# Patient Record
Sex: Female | Born: 1955 | Race: White | Hispanic: No | Marital: Married | Smoking: Former smoker
Health system: Southern US, Community
[De-identification: ages and names within clinical notes are randomized; demographics above are authoritative.]

## PROBLEM LIST (undated history)

## (undated) DIAGNOSIS — E0500 Thyrotoxicosis with diffuse goiter without thyrotoxic crisis or storm: Secondary | ICD-10-CM

## (undated) DIAGNOSIS — E039 Hypothyroidism, unspecified: Secondary | ICD-10-CM

## (undated) DIAGNOSIS — H269 Unspecified cataract: Secondary | ICD-10-CM

## (undated) DIAGNOSIS — I10 Essential (primary) hypertension: Secondary | ICD-10-CM

## (undated) DIAGNOSIS — E559 Vitamin D deficiency, unspecified: Secondary | ICD-10-CM

## (undated) DIAGNOSIS — G459 Transient cerebral ischemic attack, unspecified: Secondary | ICD-10-CM

## (undated) DIAGNOSIS — G43909 Migraine, unspecified, not intractable, without status migrainosus: Secondary | ICD-10-CM

## (undated) DIAGNOSIS — E785 Hyperlipidemia, unspecified: Secondary | ICD-10-CM

## (undated) DIAGNOSIS — E119 Type 2 diabetes mellitus without complications: Secondary | ICD-10-CM

## (undated) HISTORY — DX: Hypothyroidism, unspecified: E03.9

## (undated) HISTORY — DX: Vitamin D deficiency, unspecified: E55.9

## (undated) HISTORY — PX: ELBOW SURGERY: SHX618

## (undated) HISTORY — DX: Hyperlipidemia, unspecified: E78.5

## (undated) HISTORY — DX: Essential (primary) hypertension: I10

## (undated) HISTORY — DX: Transient cerebral ischemic attack, unspecified: G45.9

## (undated) HISTORY — DX: Thyrotoxicosis with diffuse goiter without thyrotoxic crisis or storm: E05.00

## (undated) HISTORY — DX: Type 2 diabetes mellitus without complications: E11.9

## (undated) HISTORY — DX: Migraine, unspecified, not intractable, without status migrainosus: G43.909

## (undated) HISTORY — DX: Unspecified cataract: H26.9

---

## 2009-10-01 ENCOUNTER — Emergency Department (HOSPITAL_BASED_OUTPATIENT_CLINIC_OR_DEPARTMENT_OTHER): Admission: EM | Admit: 2009-10-01 | Discharge: 2009-10-01 | Payer: Self-pay | Admitting: Emergency Medicine

## 2009-10-01 ENCOUNTER — Ambulatory Visit: Payer: Self-pay | Admitting: Diagnostic Radiology

## 2010-05-16 ENCOUNTER — Emergency Department (HOSPITAL_BASED_OUTPATIENT_CLINIC_OR_DEPARTMENT_OTHER)
Admission: EM | Admit: 2010-05-16 | Discharge: 2010-05-16 | Payer: Self-pay | Source: Home / Self Care | Admitting: Emergency Medicine

## 2010-05-16 ENCOUNTER — Emergency Department (HOSPITAL_COMMUNITY)
Admission: EM | Admit: 2010-05-16 | Discharge: 2010-05-17 | Payer: Self-pay | Source: Home / Self Care | Admitting: Emergency Medicine

## 2010-05-17 ENCOUNTER — Encounter (INDEPENDENT_AMBULATORY_CARE_PROVIDER_SITE_OTHER): Payer: Self-pay | Admitting: Emergency Medicine

## 2010-08-12 LAB — GLUCOSE, CAPILLARY
Glucose-Capillary: 110 mg/dL — ABNORMAL HIGH (ref 70–99)
Glucose-Capillary: 149 mg/dL — ABNORMAL HIGH (ref 70–99)

## 2010-08-12 LAB — POCT I-STAT, CHEM 8
BUN: 10 mg/dL (ref 6–23)
Calcium, Ion: 1.17 mmol/L (ref 1.12–1.32)
Chloride: 105 mEq/L (ref 96–112)
Potassium: 4.4 mEq/L (ref 3.5–5.1)

## 2010-08-12 LAB — DIFFERENTIAL
Basophils Absolute: 0 10*3/uL (ref 0.0–0.1)
Basophils Relative: 0 % (ref 0–1)
Eosinophils Absolute: 0.1 10*3/uL (ref 0.0–0.7)
Eosinophils Relative: 1 % (ref 0–5)
Lymphocytes Relative: 30 % (ref 12–46)
Lymphs Abs: 2.7 10*3/uL (ref 0.7–4.0)
Monocytes Absolute: 0.5 10*3/uL (ref 0.1–1.0)
Monocytes Relative: 6 % (ref 3–12)
Neutro Abs: 5.6 10*3/uL (ref 1.7–7.7)
Neutrophils Relative %: 63 % (ref 43–77)

## 2010-08-12 LAB — CBC
HCT: 39.4 % (ref 36.0–46.0)
Hemoglobin: 13.3 g/dL (ref 12.0–15.0)
MCH: 30.3 pg (ref 26.0–34.0)
MCHC: 33.8 g/dL (ref 30.0–36.0)
MCV: 89.7 fL (ref 78.0–100.0)
Platelets: 176 10*3/uL (ref 150–400)
RBC: 4.39 MIL/uL (ref 3.87–5.11)
RDW: 13.3 % (ref 11.5–15.5)
WBC: 8.9 10*3/uL (ref 4.0–10.5)

## 2010-08-12 LAB — URINALYSIS, ROUTINE W REFLEX MICROSCOPIC
Glucose, UA: NEGATIVE mg/dL
Ketones, ur: NEGATIVE mg/dL
Nitrite: NEGATIVE
Specific Gravity, Urine: 1.02 (ref 1.005–1.030)
pH: 5.5 (ref 5.0–8.0)

## 2010-08-12 LAB — LIPID PANEL
Cholesterol: 177 mg/dL (ref 0–200)
HDL: 35 mg/dL — ABNORMAL LOW (ref >39)
LDL Cholesterol: 119 mg/dL — ABNORMAL HIGH (ref 0–99)
Total CHOL/HDL Ratio: 5.1 RATIO
Triglycerides: 116 mg/dL (ref <150)
VLDL: 23 mg/dL (ref 0–40)

## 2010-08-12 LAB — APTT: aPTT: 26 seconds (ref 24–37)

## 2010-08-12 LAB — CK TOTAL AND CKMB (NOT AT ARMC)
CK, MB: 1 ng/mL (ref 0.3–4.0)
CK, MB: 1 ng/mL (ref 0.3–4.0)
Relative Index: INVALID (ref 0.0–2.5)
Relative Index: INVALID (ref 0.0–2.5)
Total CK: 47 U/L (ref 7–177)
Total CK: 48 U/L (ref 7–177)

## 2010-08-12 LAB — COMPREHENSIVE METABOLIC PANEL
ALT: 30 U/L (ref 0–35)
Albumin: 3.7 g/dL (ref 3.5–5.2)
Alkaline Phosphatase: 70 U/L (ref 39–117)
Calcium: 8.8 mg/dL (ref 8.4–10.5)
GFR calc Af Amer: >60 mL/min (ref >60)
Potassium: 4 mEq/L (ref 3.5–5.1)
Sodium: 140 mEq/L (ref 135–145)
Total Protein: 6.5 g/dL (ref 6.0–8.3)

## 2010-08-12 LAB — TROPONIN I
Troponin I: 0.01 ng/mL (ref 0.00–0.06)
Troponin I: 0.02 ng/mL (ref 0.00–0.06)

## 2010-08-12 LAB — HEMOGLOBIN A1C: Mean Plasma Glucose: 177 mg/dL — ABNORMAL HIGH (ref <117)

## 2010-08-12 LAB — PROTIME-INR: Prothrombin Time: 13.4 seconds (ref 11.6–15.2)

## 2011-06-05 IMAGING — CR DG KNEE COMPLETE 4+V*R*
4 series · 4 of 4 positions shown · non-contrast
Comparison: None.

CLINICAL DATA: Fall, right knee pain

RIGHT KNEE - COMPLETE 4+ VIEW

[t knee ap right]
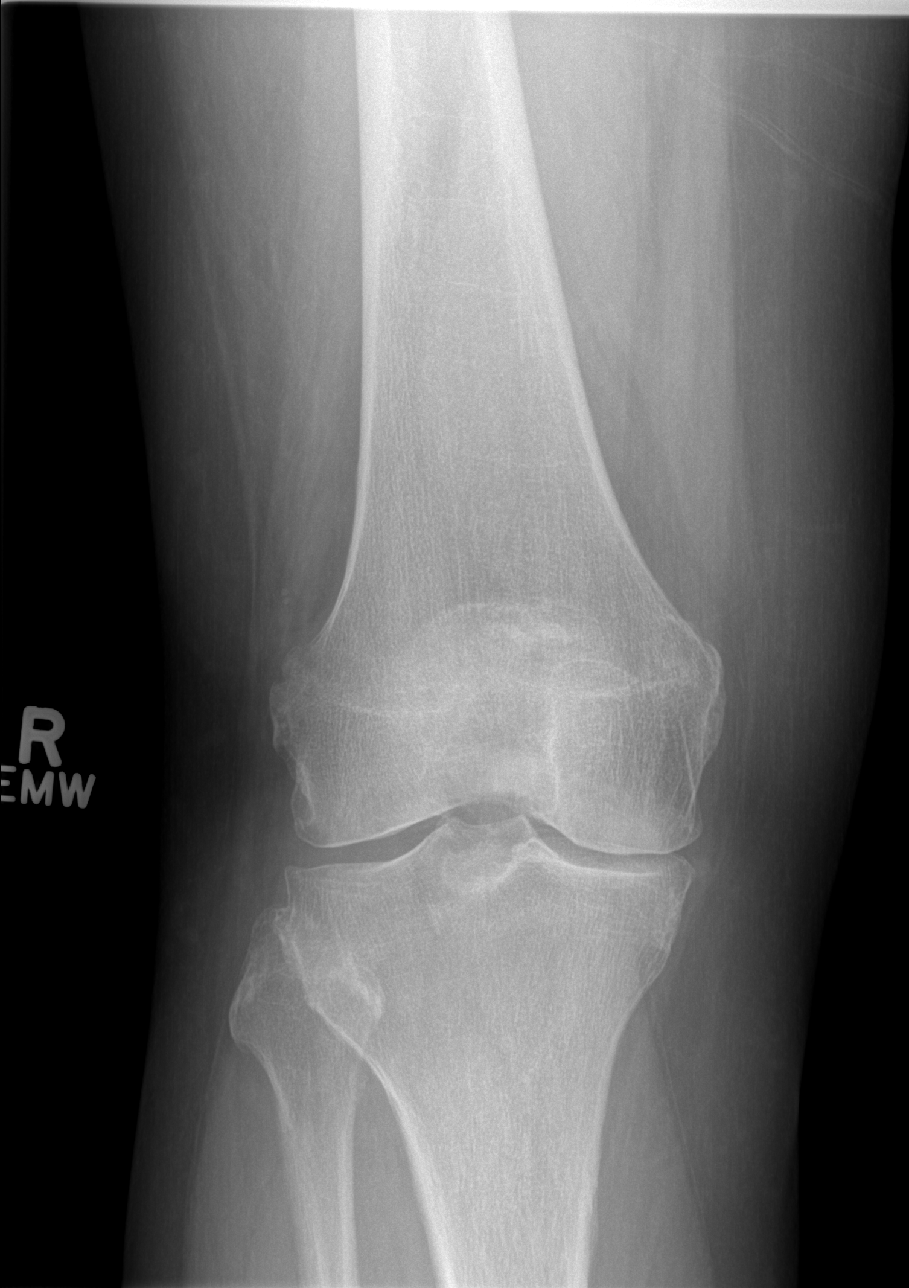

[t knee oblique right (1 of 2)]
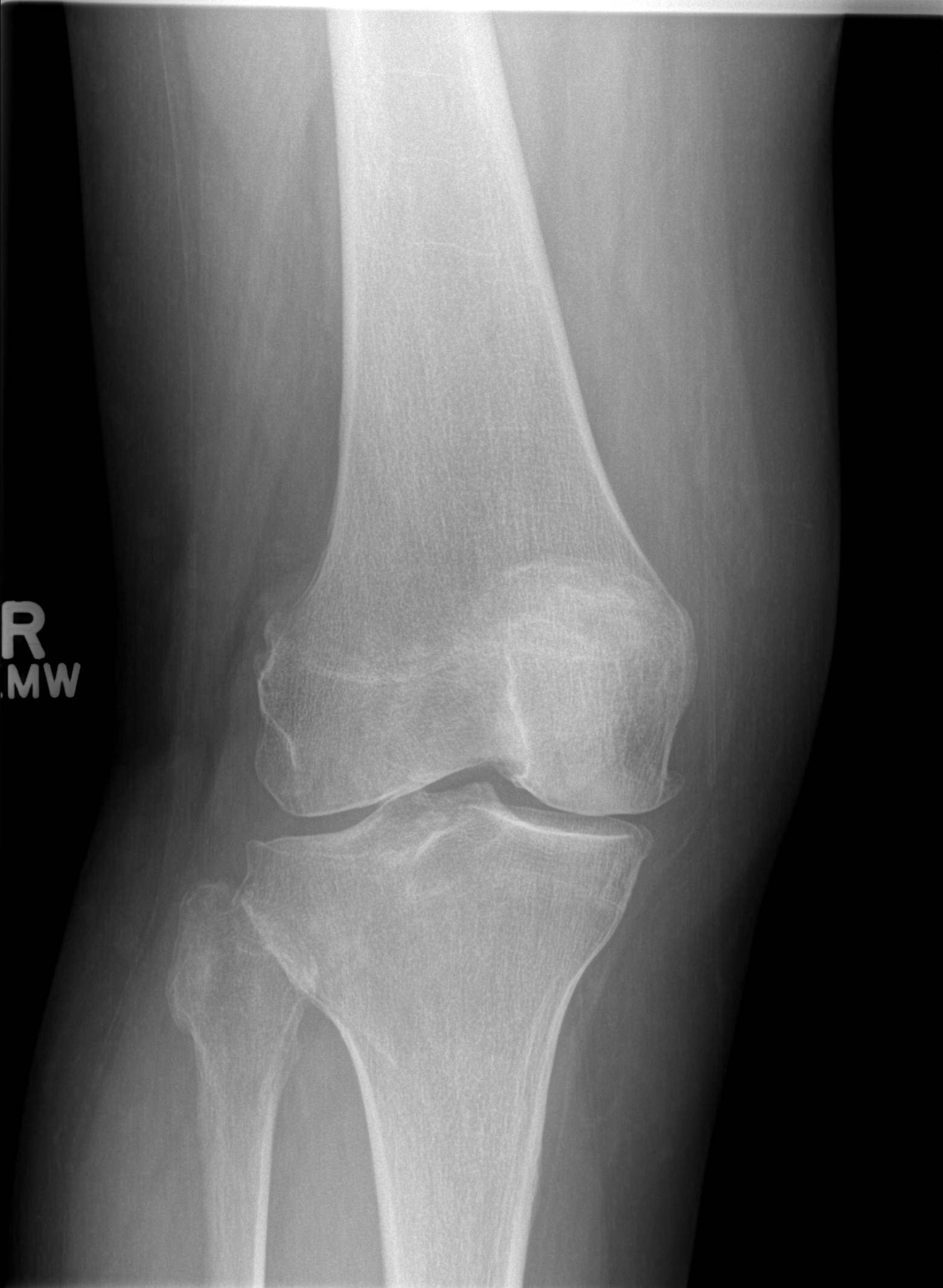

[t knee oblique right (2 of 2)]
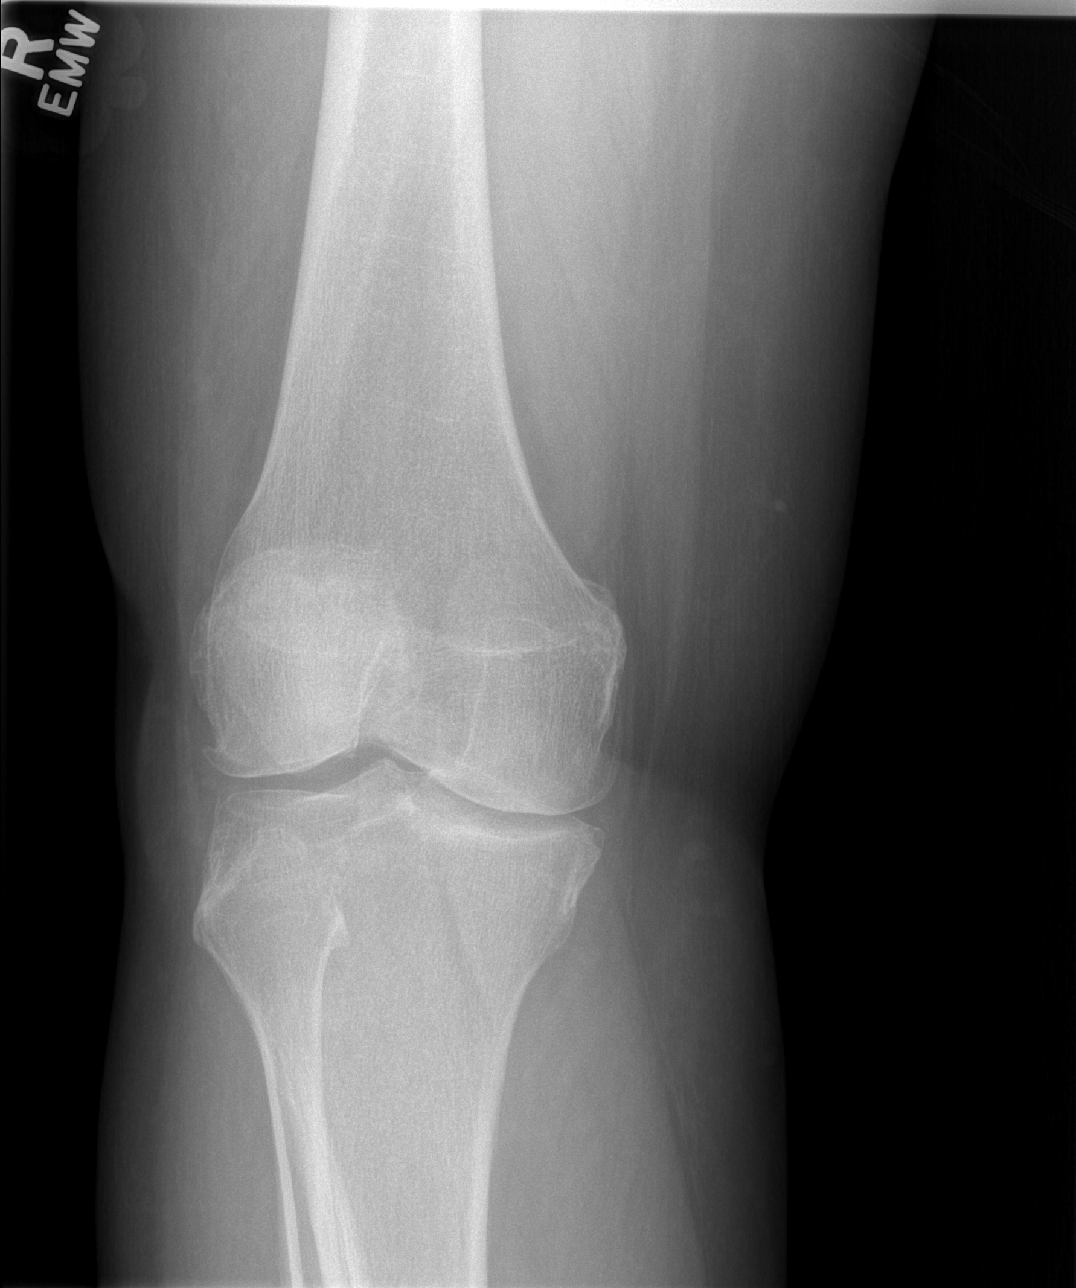

[t knee lat right]
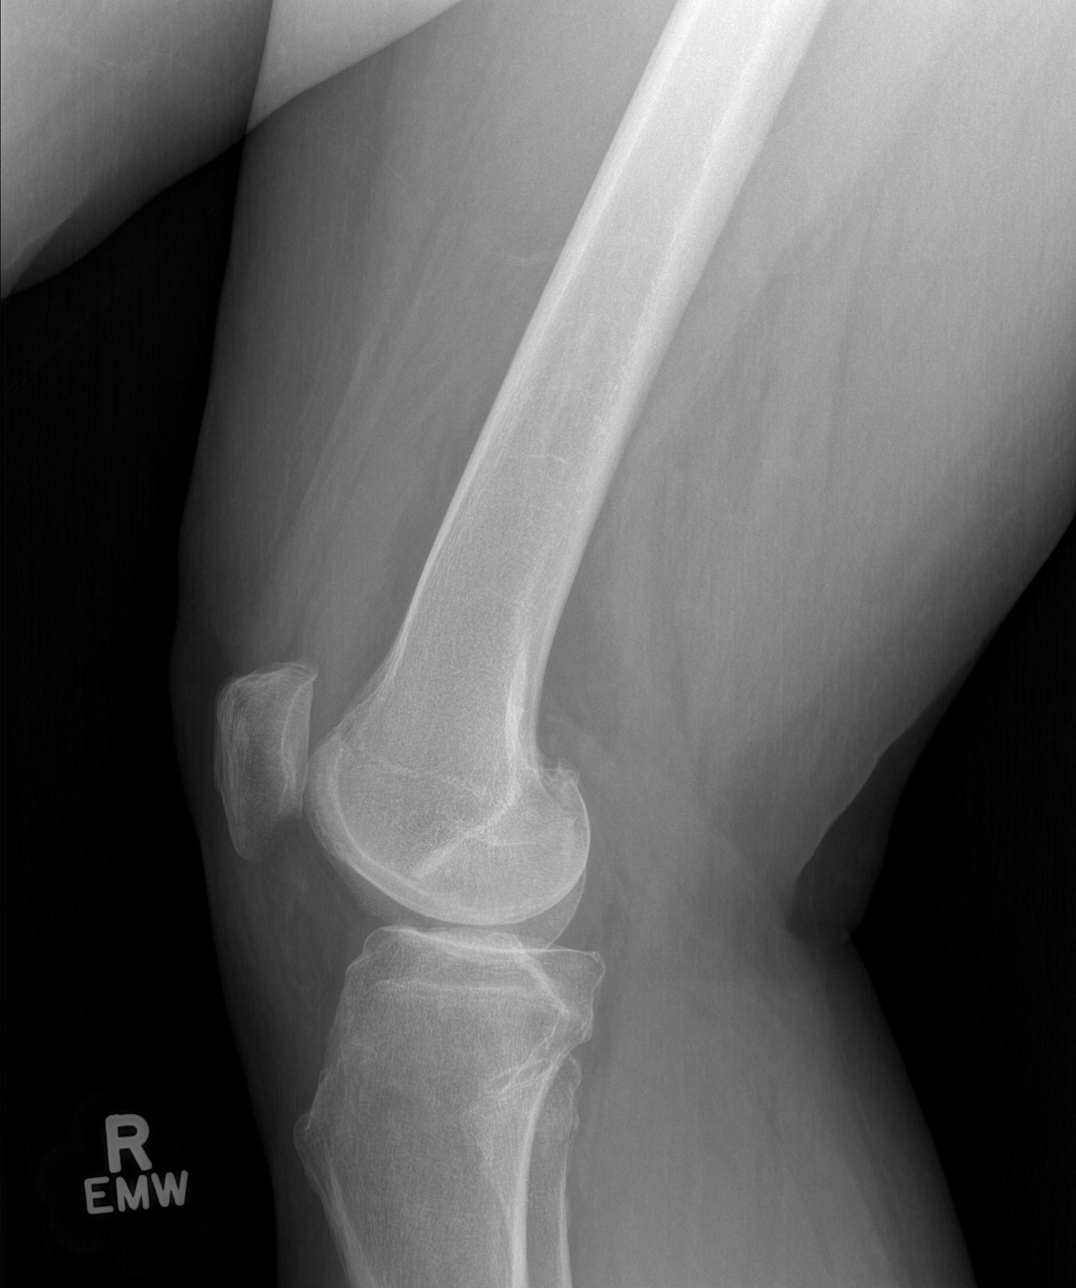

[4 of 4 positions shown; findings below may reference images not displayed]

FINDINGS: Four views of the right knee submitted.  No acute
fracture or subluxation.  Mild degenerative changes are noted with
narrowing of medial joint compartment and mild spurring of medial
femoral condyle and medial tibial plateau.  Probable small joint
effusion.
IMPRESSION: No acute fracture or subluxation.  Mild degenerative changes.

## 2012-09-11 ENCOUNTER — Other Ambulatory Visit: Payer: Self-pay | Admitting: Family Medicine

## 2012-09-13 NOTE — Telephone Encounter (Signed)
This patient has an appt in July.  She also needs to be scheduled for labs. Can you please add for labs a week before her appt. for her labs. Thanks. PG

## 2012-12-05 ENCOUNTER — Other Ambulatory Visit: Payer: Self-pay | Admitting: Family Medicine

## 2012-12-08 ENCOUNTER — Other Ambulatory Visit: Payer: Self-pay | Admitting: Family Medicine

## 2012-12-15 ENCOUNTER — Other Ambulatory Visit: Payer: Self-pay | Admitting: *Deleted

## 2012-12-15 DIAGNOSIS — E039 Hypothyroidism, unspecified: Secondary | ICD-10-CM

## 2012-12-15 DIAGNOSIS — E785 Hyperlipidemia, unspecified: Secondary | ICD-10-CM

## 2012-12-16 ENCOUNTER — Other Ambulatory Visit: Payer: Self-pay

## 2012-12-16 LAB — T3, FREE: T3, Free: 3.4 pg/mL (ref 2.3–4.2)

## 2012-12-16 LAB — COMPLETE METABOLIC PANEL WITH GFR
ALT: 39 U/L — ABNORMAL HIGH (ref 0–35)
AST: 36 U/L (ref 0–37)
Albumin: 4.4 g/dL (ref 3.5–5.2)
Alkaline Phosphatase: 75 U/L (ref 39–117)
BUN: 9 mg/dL (ref 6–23)
CO2: 25 mEq/L (ref 19–32)
Calcium: 9.4 mg/dL (ref 8.4–10.5)
Chloride: 98 mEq/L (ref 96–112)
Creat: 0.83 mg/dL (ref 0.50–1.10)
GFR, Est African American: >89 mL/min
GFR, Est Non African American: 78 mL/min
Glucose, Bld: 234 mg/dL — ABNORMAL HIGH (ref 70–99)
Potassium: 4.1 mEq/L (ref 3.5–5.3)
Sodium: 135 mEq/L (ref 135–145)
Total Bilirubin: 0.6 mg/dL (ref 0.3–1.2)
Total Protein: 6.9 g/dL (ref 6.0–8.3)

## 2012-12-16 LAB — LIPID PANEL
Cholesterol: 147 mg/dL (ref 0–200)
HDL: 38 mg/dL — ABNORMAL LOW (ref >39)
LDL Cholesterol: 76 mg/dL (ref 0–99)
Total CHOL/HDL Ratio: 3.9 Ratio
Triglycerides: 166 mg/dL — ABNORMAL HIGH (ref <150)
VLDL: 33 mg/dL (ref 0–40)

## 2012-12-16 LAB — TSH: TSH: 3.695 u[IU]/mL (ref 0.350–4.500)

## 2012-12-16 LAB — T4, FREE: Free T4: 1.55 ng/dL (ref 0.80–1.80)

## 2012-12-20 ENCOUNTER — Encounter: Payer: Self-pay | Admitting: Family Medicine

## 2012-12-20 ENCOUNTER — Ambulatory Visit (INDEPENDENT_AMBULATORY_CARE_PROVIDER_SITE_OTHER): Payer: Commercial Indemnity | Admitting: Family Medicine

## 2012-12-20 VITALS — BP 115/80 | HR 71 | Wt 213.0 lb

## 2012-12-20 DIAGNOSIS — E039 Hypothyroidism, unspecified: Secondary | ICD-10-CM

## 2012-12-20 DIAGNOSIS — E119 Type 2 diabetes mellitus without complications: Secondary | ICD-10-CM

## 2012-12-20 DIAGNOSIS — M25569 Pain in unspecified knee: Secondary | ICD-10-CM

## 2012-12-20 DIAGNOSIS — E785 Hyperlipidemia, unspecified: Secondary | ICD-10-CM

## 2012-12-20 DIAGNOSIS — M25562 Pain in left knee: Secondary | ICD-10-CM

## 2012-12-20 DIAGNOSIS — N760 Acute vaginitis: Secondary | ICD-10-CM

## 2012-12-20 DIAGNOSIS — I10 Essential (primary) hypertension: Secondary | ICD-10-CM

## 2012-12-20 LAB — T3, REVERSE: T3, Reverse: 22 ng/dL (ref 8–25)

## 2012-12-20 LAB — POCT GLYCOSYLATED HEMOGLOBIN (HGB A1C): Hemoglobin A1C: 9.2

## 2012-12-20 MED ORDER — DAPAGLIFLOZIN PROPANEDIOL 10 MG PO TABS: 10.0000 mg | ORAL_TABLET | Freq: Every day | ORAL | Status: DC

## 2012-12-20 MED ORDER — METRONIDAZOLE 0.75 % VA GEL: 1.0000 | Freq: Two times a day (BID) | VAGINAL | Status: DC

## 2012-12-20 MED ORDER — LIOTHYRONINE SODIUM 5 MCG PO TABS: 5.0000 ug | ORAL_TABLET | Freq: Every day | ORAL | Status: AC

## 2012-12-20 MED ORDER — SITAGLIP PHOS-METFORMIN HCL ER 50-1000 MG PO TB24: 1.0000 | ORAL_TABLET | Freq: Two times a day (BID) | ORAL | Status: DC

## 2012-12-20 MED ORDER — PRAVASTATIN SODIUM 40 MG PO TABS: 40.0000 mg | ORAL_TABLET | Freq: Every day | ORAL | Status: DC

## 2012-12-20 MED ORDER — INSULIN DETEMIR 100 UNIT/ML FLEXPEN: PEN_INJECTOR | SUBCUTANEOUS | Status: DC

## 2012-12-20 MED ORDER — DICLOFENAC SODIUM 1 % TD GEL: TRANSDERMAL | Status: AC

## 2012-12-20 MED ORDER — LEVOTHYROXINE SODIUM 150 MCG PO TABS: 150.0000 ug | ORAL_TABLET | Freq: Every day | ORAL | Status: DC

## 2012-12-20 MED ORDER — ATENOLOL 50 MG PO TABS: 50.0000 mg | ORAL_TABLET | Freq: Every day | ORAL | Status: DC

## 2012-12-20 NOTE — Progress Notes (Signed)
  Subjective:    Patient ID: Jocelyn Todd, female    DOB: [birth year], 57 y.o.   MRN: [id number]  HPI  [name] is here today with her husband to go over her most recent lab results, discuss the conditions listed below and get several of her medications refilled.    1)  Type II DM:  Her sugars are not looking as good as they were.  She stopped taking Invokana because she felt that it was giving her a vaginal/bladder infection.  She is still taking Janumet XR and Levemir.    2)  Hypothyroidism:  She feels her current dosage of Cytomel and Synthroid are not helping with her feeling fatigued.    3)  Left Knee Pain:  She is having trouble with left knee pain.  She fell about two months ago and injured her left hip and knee.  She did not have a medical evaluation after her fall and she continues to struggle with  pain.  She added Glucosamine which has helped her some.     Review of Systems  Constitutional: Positive for fatigue.  Musculoskeletal: Positive for arthralgias.       Left hip and left knee  Psychiatric/Behavioral:       Irritability     Past Medical History  Diagnosis Date  . Hypertension   . Diabetes mellitus without complication     Type II  . Hyperlipidemia   . Migraine headache   . Hypothyroidism   . Graves disease   . TIA (transient ischemic attack)   . Vitamin D deficiency     Family History  Problem Relation Age of Onset  . Diabetes Mother   . Hypertension Mother   . Heart disease Father   . Diabetes Father   . Hypertension Father     History   Social History Narrative   Marital Status: Married Engineer, technical sales)   Children:  Step-Daughter Carlyon Shadow) Son Earna Coder)    Pets:  Cat   Living Situation: Lives with spouse and son    Occupation: Armed forces logistics/support/administrative officer for [employer]   Education: Engineer, agricultural   Tobacco Use/Exposure:  Quit in 2000 after 15 years of smoking   Alcohol Use:  Occasional   Drug Use:  None   Diet:  Weight Watchers   Exercise:   Moderate ([place])    Hobbies: Cross-Stitch, Cooking               Objective:   Physical Exam  Constitutional: She appears well-nourished. No distress.  HENT:  Head: Normocephalic.  Eyes: No scleral icterus.  Neck: No thyromegaly present.  Cardiovascular: Normal rate, regular rhythm and normal heart sounds.   Pulmonary/Chest: Effort normal and breath sounds normal.  Abdominal: There is no tenderness.  Musculoskeletal: She exhibits no edema and no tenderness.  Neurological: She is alert.  Skin: Skin is warm and dry.  Psychiatric: She has a normal mood and affect. Her behavior is normal. Judgment and thought content normal.          Assessment & Plan:

## 2012-12-20 NOTE — Patient Instructions (Addendum)
1)  Blood Sugar - A1c is too high so...you need to either increase the Levemir and/or add some [place].  Continue to try to work hard on diet and exercise.        Diabetes and Exercise Regular exercise is important and can help:   Control blood glucose (sugar).  Decrease blood pressure.    Control blood lipids (cholesterol, triglycerides).  Improve overall health. BENEFITS FROM EXERCISE  Improved fitness.  Improved flexibility.  Improved endurance.  Increased bone density.  Weight control.  Increased muscle strength.  Decreased body fat.  Improvement of the body's use of insulin, a hormone.  Increased insulin sensitivity.  Reduction of insulin needs.  Reduced stress and tension.  Helps you feel better. People with diabetes who add exercise to their lifestyle gain additional benefits, including:  Weight loss.  Reduced appetite.  Improvement of the body's use of blood glucose.  Decreased risk factors for heart disease:  Lowering of cholesterol and triglycerides.  Raising the level of good cholesterol (high-density lipoproteins, HDL).  Lowering blood sugar.  Decreased blood pressure. TYPE 1 DIABETES AND EXERCISE  Exercise will usually lower your blood glucose.  If blood glucose is greater than 240 mg/dl, check urine ketones. If ketones are present, do not exercise.  Location of the insulin injection sites may need to be adjusted with exercise. Avoid injecting insulin into areas of the body that will be exercised. For example, avoid injecting insulin into:  The arms when playing tennis.  The legs when jogging. For more information, discuss this with your caregiver.  Keep a record of:  Food intake.  Type and amount of exercise.  Expected peak times of insulin action.  Blood glucose levels. Do this before, during, and after exercise. Review your records with your caregiver. This will help you to develop guidelines for adjusting food intake  and insulin amounts.  TYPE 2 DIABETES AND EXERCISE  Regular physical activity can help control blood glucose.  Exercise is important because it may:  Increase the body's sensitivity to insulin.  Improve blood glucose control.  Exercise reduces the risk of heart disease. It decreases serum cholesterol and triglycerides. It also lowers blood pressure.  Those who take insulin or oral hypoglycemic agents should watch for signs of hypoglycemia. These signs include dizziness, shaking, sweating, chills, and confusion.  Body water is lost during exercise. It must be replaced. This will help to avoid loss of body fluids (dehydration) or heat stroke. Be sure to talk to your caregiver before starting an exercise program to make sure it is safe for you. Remember, any activity is better than none.  Document Released: 08/09/2003 Document Revised: 08/11/2011 Document Reviewed: 11/23/2008 [facility] Patient Information 2014 [place], [place].

## 2013-01-29 ENCOUNTER — Encounter: Payer: Self-pay | Admitting: Family Medicine

## 2013-01-29 DIAGNOSIS — N760 Acute vaginitis: Secondary | ICD-10-CM | POA: Insufficient documentation

## 2013-01-29 DIAGNOSIS — E119 Type 2 diabetes mellitus without complications: Secondary | ICD-10-CM | POA: Insufficient documentation

## 2013-01-29 DIAGNOSIS — E039 Hypothyroidism, unspecified: Secondary | ICD-10-CM | POA: Insufficient documentation

## 2013-01-29 DIAGNOSIS — I10 Essential (primary) hypertension: Secondary | ICD-10-CM | POA: Insufficient documentation

## 2013-01-29 DIAGNOSIS — E785 Hyperlipidemia, unspecified: Secondary | ICD-10-CM | POA: Insufficient documentation

## 2013-03-21 ENCOUNTER — Ambulatory Visit (INDEPENDENT_AMBULATORY_CARE_PROVIDER_SITE_OTHER): Payer: Commercial Indemnity | Admitting: *Deleted

## 2013-03-21 DIAGNOSIS — Z23 Encounter for immunization: Secondary | ICD-10-CM

## 2013-04-12 ENCOUNTER — Other Ambulatory Visit: Payer: Self-pay | Admitting: Family Medicine

## 2013-05-28 ENCOUNTER — Other Ambulatory Visit: Payer: Self-pay | Admitting: Family Medicine

## 2013-06-24 ENCOUNTER — Other Ambulatory Visit: Payer: Self-pay | Admitting: Family Medicine

## 2013-07-18 ENCOUNTER — Other Ambulatory Visit: Payer: Self-pay | Admitting: *Deleted

## 2013-07-18 DIAGNOSIS — R5381 Other malaise: Secondary | ICD-10-CM

## 2013-07-18 DIAGNOSIS — E785 Hyperlipidemia, unspecified: Secondary | ICD-10-CM

## 2013-07-18 DIAGNOSIS — E1165 Type 2 diabetes mellitus with hyperglycemia: Secondary | ICD-10-CM

## 2013-07-18 DIAGNOSIS — R5383 Other fatigue: Secondary | ICD-10-CM

## 2013-07-18 DIAGNOSIS — IMO0001 Reserved for inherently not codable concepts without codable children: Secondary | ICD-10-CM

## 2013-07-18 DIAGNOSIS — E039 Hypothyroidism, unspecified: Secondary | ICD-10-CM

## 2013-07-19 ENCOUNTER — Other Ambulatory Visit: Payer: Commercial Indemnity

## 2013-07-20 ENCOUNTER — Other Ambulatory Visit: Payer: Commercial Indemnity

## 2013-07-21 ENCOUNTER — Other Ambulatory Visit (INDEPENDENT_AMBULATORY_CARE_PROVIDER_SITE_OTHER): Payer: Commercial Indemnity

## 2013-07-21 DIAGNOSIS — I10 Essential (primary) hypertension: Secondary | ICD-10-CM

## 2013-07-21 DIAGNOSIS — E119 Type 2 diabetes mellitus without complications: Secondary | ICD-10-CM

## 2013-07-21 DIAGNOSIS — E039 Hypothyroidism, unspecified: Secondary | ICD-10-CM

## 2013-07-21 DIAGNOSIS — E785 Hyperlipidemia, unspecified: Secondary | ICD-10-CM

## 2013-07-21 LAB — CBC WITH DIFFERENTIAL/PLATELET
Basophils Absolute: 0.1 10*3/uL (ref 0.0–0.1)
Basophils Relative: 1 % (ref 0–1)
Eosinophils Absolute: 0.4 10*3/uL (ref 0.0–0.7)
Eosinophils Relative: 4 % (ref 0–5)
HCT: 45 % (ref 36.0–46.0)
Hemoglobin: 15.4 g/dL — ABNORMAL HIGH (ref 12.0–15.0)
Lymphocytes Relative: 28 % (ref 12–46)
Lymphs Abs: 2.6 10*3/uL (ref 0.7–4.0)
MCH: 30.4 pg (ref 26.0–34.0)
MCHC: 34.2 g/dL (ref 30.0–36.0)
MCV: 88.9 fL (ref 78.0–100.0)
Monocytes Absolute: 0.5 10*3/uL (ref 0.1–1.0)
Monocytes Relative: 5 % (ref 3–12)
Neutro Abs: 5.7 10*3/uL (ref 1.7–7.7)
Neutrophils Relative %: 62 % (ref 43–77)
Platelets: 200 10*3/uL (ref 150–400)
RBC: 5.06 MIL/uL (ref 3.87–5.11)
RDW: 15.2 % (ref 11.5–15.5)
WBC: 9.2 10*3/uL (ref 4.0–10.5)

## 2013-07-21 LAB — LIPID PANEL
Cholesterol: 157 mg/dL (ref 0–200)
HDL: 45 mg/dL (ref >39)
LDL Cholesterol: 73 mg/dL (ref 0–99)
Total CHOL/HDL Ratio: 3.5 Ratio
Triglycerides: 197 mg/dL — ABNORMAL HIGH (ref <150)
VLDL: 39 mg/dL (ref 0–40)

## 2013-07-21 LAB — HEMOGLOBIN A1C
Hgb A1c MFr Bld: 9.2 % — ABNORMAL HIGH (ref <5.7)
Mean Plasma Glucose: 217 mg/dL — ABNORMAL HIGH (ref <117)

## 2013-07-21 LAB — COMPLETE METABOLIC PANEL WITH GFR
ALT: 51 U/L — ABNORMAL HIGH (ref 0–35)
AST: 41 U/L — ABNORMAL HIGH (ref 0–37)
Albumin: 4.8 g/dL (ref 3.5–5.2)
Alkaline Phosphatase: 77 U/L (ref 39–117)
BUN: 13 mg/dL (ref 6–23)
CO2: 28 mEq/L (ref 19–32)
Calcium: 10 mg/dL (ref 8.4–10.5)
Chloride: 99 mEq/L (ref 96–112)
Creat: 0.7 mg/dL (ref 0.50–1.10)
GFR, Est African American: >89 mL/min
GFR, Est Non African American: >89 mL/min
Glucose, Bld: 242 mg/dL — ABNORMAL HIGH (ref 70–99)
Potassium: 4.4 mEq/L (ref 3.5–5.3)
Sodium: 137 mEq/L (ref 135–145)
Total Bilirubin: 0.6 mg/dL (ref 0.2–1.2)
Total Protein: 7.5 g/dL (ref 6.0–8.3)

## 2013-07-21 LAB — T3, FREE: T3, Free: 3.4 pg/mL (ref 2.3–4.2)

## 2013-07-21 LAB — T4, FREE: Free T4: 1.48 ng/dL (ref 0.80–1.80)

## 2013-07-21 LAB — TSH: TSH: 2.222 u[IU]/mL (ref 0.350–4.500)

## 2013-08-05 ENCOUNTER — Encounter: Payer: Self-pay | Admitting: Family Medicine

## 2013-08-05 ENCOUNTER — Encounter (INDEPENDENT_AMBULATORY_CARE_PROVIDER_SITE_OTHER): Payer: Self-pay

## 2013-08-05 ENCOUNTER — Ambulatory Visit (INDEPENDENT_AMBULATORY_CARE_PROVIDER_SITE_OTHER): Payer: Commercial Indemnity | Admitting: Family Medicine

## 2013-08-05 VITALS — BP 133/82 | HR 64 | Resp 16 | Ht 64.0 in | Wt 207.0 lb

## 2013-08-05 DIAGNOSIS — R110 Nausea: Secondary | ICD-10-CM

## 2013-08-05 DIAGNOSIS — IMO0001 Reserved for inherently not codable concepts without codable children: Secondary | ICD-10-CM

## 2013-08-05 DIAGNOSIS — E1165 Type 2 diabetes mellitus with hyperglycemia: Secondary | ICD-10-CM

## 2013-08-05 DIAGNOSIS — Z789 Other specified health status: Secondary | ICD-10-CM

## 2013-08-05 DIAGNOSIS — E785 Hyperlipidemia, unspecified: Secondary | ICD-10-CM

## 2013-08-05 DIAGNOSIS — E039 Hypothyroidism, unspecified: Secondary | ICD-10-CM

## 2013-08-05 MED ORDER — INSULIN PEN NEEDLE 32G X 6 MM MISC: 1.0000 "pen " | Freq: Every day | Status: DC

## 2013-08-05 MED ORDER — DAPAGLIFLOZIN PRO-METFORMIN ER 10-1000 MG PO TB24: 1.0000 | ORAL_TABLET | Freq: Every day | ORAL | Status: DC

## 2013-08-05 MED ORDER — LEVOTHYROXINE SODIUM 150 MCG PO TABS: 150.0000 ug | ORAL_TABLET | Freq: Every day | ORAL | Status: AC

## 2013-08-05 MED ORDER — PROMETHAZINE HCL 12.5 MG PO TABS: ORAL_TABLET | ORAL | Status: DC

## 2013-08-05 MED ORDER — LIRAGLUTIDE 18 MG/3ML ~~LOC~~ SOPN: 1.8000 mg | PEN_INJECTOR | Freq: Every day | SUBCUTANEOUS | Status: DC

## 2013-08-05 NOTE — Progress Notes (Signed)
Subjective:    Patient ID: Jocelyn LowensteinCynthia Todd, female    DOB: 1956/03/11, 58 y.o.   MRN: [id number]  HPI  Jocelyn Todd is here today to go over her most recent lab results, get medication refills and to go over the conditions listed below:    1)  Left Hip Pain - She has been struggling with hip pain which she describes as "severe" at times.  She takes Glucosamine and tries to walk and that relieves some of her pain.  She was told at [facility] that she has arthritis.  She would like to have a disability form completed.    2) Type II DM - She has not been watching her sugars very carefully.  She has been taking a combination of Janumet and [place]xiga.    3)  Hypothyroidism - She feels fine on levothyroxine 137 mcg.    4)  Hyperlipidemia - She is doing well on pravastatin.     Review of Systems  Constitutional: Negative for activity change, appetite change, fatigue and unexpected weight change.  HENT: Negative.   Eyes: Negative.   Respiratory: Negative for chest tightness and shortness of breath.   Cardiovascular: Negative for chest pain, palpitations and leg swelling.  Gastrointestinal: Negative for diarrhea and constipation.  Endocrine: Negative.  Negative for polydipsia, polyphagia and polyuria.  Genitourinary: Negative for urgency, frequency and difficulty urinating.  Musculoskeletal: Negative.        Left Hip Pain  Skin: Negative.   Neurological: Negative.  Negative for light-headedness and numbness.  Hematological: Negative for adenopathy. Does not bruise/bleed easily.  Psychiatric/Behavioral: Negative for sleep disturbance and dysphoric mood. The patient is not nervous/anxious.     Past Medical History  Diagnosis Date  . Hypertension   . Diabetes mellitus without complication     Type II  . Hyperlipidemia   . Migraine headache   . Hypothyroidism   . Graves disease   . TIA (transient ischemic attack)   . Vitamin D deficiency   . Cataract      Past Surgical  History  Procedure Laterality Date  . 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 Diabetes Mother   . Hypertension Mother   . Heart disease Father   . Diabetes Father   . Hypertension Father      Current Outpatient Prescriptions on File Prior to Visit  Medication Sig Dispense Refill  . aspirin 325 MG tablet Take 325 mg by mouth daily.      Marland Kitchen. atenolol (TENORMIN) 50 MG tablet Take 1 tablet (50 mg total) by mouth daily.  30 tablet  11  . Cholecalciferol (HM VITAMIN D3) 2000 UNITS CAPS Take 1 capsule by mouth daily.      . Dapagliflozin Propanediol (FARXIGA) 10 MG TABS Take 10 mg by mouth daily.  30 tablet  5  . diclofenac sodium (VOLTAREN) 1 % GEL Apply up to 4 grams to 2 painful areas up to QID  10 Tube  5  . glucose blood test strip 1 each by Other route as needed for other. Use as instructed      . JANUMET XR 50-1000 MG TB24 TAKE 1 TWICE DAILY  60 tablet  0  . Lancets MISC by Does not apply route 3 (three) times daily.      Marland Kitchen liothyronine (CYTOMEL) 5 MCG tablet Take 1 tablet (5 mcg total) by mouth daily.  30 tablet  11  . pravastatin (PRAVACHOL) 40 MG tablet Take 1 tablet (40 mg total) by mouth at bedtime.  30 tablet  11  . vitamin B-12 (CYANOCOBALAMIN) 1000 MCG tablet Take 1,000 mcg by mouth daily.       No current facility-administered medications on file prior to visit.     Allergies  Allergen Reactions  . Benadryl [Diphenhydramine]     Headache   . Codeine       Severe mood change     Immunization History  Administered Date(s) Administered  . Influenza,inj,Quad PF,36+ Mos 03/21/2013  . Pneumococcal Conjugate-13 03/22/2009  . Tdap 02/04/2007        Objective:   Physical Exam  Vitals reviewed. Constitutional: She is oriented to person, place, and time.  Eyes: Conjunctivae are normal. No scleral icterus.  Neck: Neck supple. No thyromegaly present.  Cardiovascular: Normal rate, regular rhythm and normal heart sounds.   Pulmonary/Chest: Effort normal and breath sounds normal.  Musculoskeletal: She exhibits no edema and no tenderness.  Lymphadenopathy:    She has no cervical adenopathy.  Neurological: She is alert and oriented to person, place, and time.  Skin: Skin is warm and dry.  Psychiatric: She has a normal mood and affect. Her behavior is normal. Judgment and thought content normal.      Assessment & Plan:    Jocelyn Todd was seen today for medication management.  Diagnoses and associated orders for this visit:  Other and unspecified hyperlipidemia Comments: Refilled her pravastatin.   Unspecified hypothyroidism Comments: We'll increase her Synthroid to 150 mcg to try to get her TSH closer to 1.   - levothyroxine (SYNTHROID, LEVOTHROID) 150 MCG tablet; Take 1 tablet (150 mcg total) by mouth daily before breakfast. - TSH; Future - T4, free; Future - T3, free; Future  Type II or unspecified type diabetes mellitus without mention of complication, uncontrolled Comments: We are losing control of her sugar.  She is going to try the combination of Xigduo XR and Victoza.  She was also encouraged to get Dr. Miguel Todd book  " The End of Diabetes".  - Dapagliflozin-Metformin HCl ER (XIGDUO XR) 03-999 MG TB24; Take 1 tablet by mouth daily. - Liraglutide 18 MG/3ML SOPN; Inject 1.8 mg into the skin at bedtime. - Insulin Pen Needle (NOVOFINE) 32G X 6 MM MISC; Inject 1 pen into the skin daily. - COMPLETE METABOLIC PANEL WITH GFR;  Future - Hemoglobin A1c; Future  Unknown varicella vaccination status - Varicella zoster antibody, IgG; Future  Nausea alone - promethazine (PHENERGAN) 12.5 MG tablet; Take 1/2 - 1 tab every 4-6 hours for nausea   TIME SPENT "FACE TO FACE" WITH PATIENT -  40 MINS

## 2013-08-05 NOTE — Patient Instructions (Addendum)
1)  Type II DM - We are changing your meds to the following:  Farxiga to Xigduo XR and add Victoza .6 mg then 1.2 then 1.8 - take some Phenergan if needed for nausea.  Look into getting the book by Dr. Monico HoarJoel Fuhrman - The End of Diabetes. Increase your exercise.  Get eye exam.  We will recheck your labs in 3 months.    2)  Hypothyroid - We are increasing your Synthroid to 150 mcg.  Take one extra per week of your 137 till gone.      Diabetes and Exercise Exercising regularly is important. It is not just about losing weight. It has many health benefits, such as:  Improving your overall fitness, flexibility, and endurance.  Increasing your bone density.  Helping with weight control.  Decreasing your body fat.  Increasing your muscle strength.  Reducing stress and tension.  Improving your overall health. People with diabetes who exercise gain additional benefits because exercise:  Reduces appetite.  Improves the body's use of blood sugar (glucose).  Helps lower or control blood glucose.  Decreases blood pressure.  Helps control blood lipids (such as cholesterol and triglycerides).  Improves the body's use of the hormone insulin by:  Increasing the body's insulin sensitivity.  Reducing the body's insulin needs.  Decreases the risk for heart disease because exercising:  Lowers cholesterol and triglycerides levels.  Increases the levels of good cholesterol (such as high-density lipoproteins [HDL]) in the body.  Lowers blood glucose levels. YOUR ACTIVITY PLAN  Choose an activity that you enjoy and set realistic goals. Your health care provider or diabetes educator can help you make an activity plan that works for you. You can break activities into 2 or 3 sessions throughout the day. Doing so is as good as one long session. Exercise ideas include:  Taking the dog for a walk.  Taking the stairs instead of the elevator.  Dancing to your favorite song.  Doing your favorite  exercise with a friend. RECOMMENDATIONS FOR EXERCISING WITH TYPE 1 OR TYPE 2 DIABETES   Check your blood glucose before exercising. If blood glucose levels are greater than 240 mg/dL, check for urine ketones. Do not exercise if ketones are present.  Avoid injecting insulin into areas of the body that are going to be exercised. For example, avoid injecting insulin into:  The arms when playing tennis.  The legs when jogging.  Keep a record of:  Food intake before and after you exercise.  Expected peak times of insulin action.  Blood glucose levels before and after you exercise.  The type and amount of exercise you have done.  Review your records with your health care provider. Your health care provider will help you to develop guidelines for adjusting food intake and insulin amounts before and after exercising.  If you take insulin or oral hypoglycemic agents, watch for signs and symptoms of hypoglycemia. They include:  Dizziness.  Shaking.  Sweating.  Chills.  Confusion.  Drink plenty of water while you exercise to prevent dehydration or heat stroke. Body water is lost during exercise and must be replaced.  Talk to your health care provider before starting an exercise program to make sure it is safe for you. Remember, almost any type of activity is better than none. Document Released: 08/09/2003 Document Revised: 01/19/2013 Document Reviewed: 10/26/2012 [facility] Patient Information 2014 [place], [place].

## 2013-08-06 ENCOUNTER — Other Ambulatory Visit: Payer: Self-pay | Admitting: Family Medicine

## 2013-08-20 ENCOUNTER — Other Ambulatory Visit: Payer: Self-pay | Admitting: Family Medicine

## 2013-09-29 LAB — HM DIABETES EYE EXAM

## 2013-10-28 ENCOUNTER — Encounter: Payer: Self-pay | Admitting: *Deleted

## 2013-11-04 ENCOUNTER — Other Ambulatory Visit: Payer: Self-pay | Admitting: *Deleted

## 2013-11-04 DIAGNOSIS — IMO0001 Reserved for inherently not codable concepts without codable children: Secondary | ICD-10-CM

## 2013-11-04 DIAGNOSIS — Z789 Other specified health status: Secondary | ICD-10-CM

## 2013-11-04 DIAGNOSIS — E039 Hypothyroidism, unspecified: Secondary | ICD-10-CM

## 2013-11-04 DIAGNOSIS — E1165 Type 2 diabetes mellitus with hyperglycemia: Secondary | ICD-10-CM

## 2013-11-07 ENCOUNTER — Other Ambulatory Visit: Payer: Commercial Indemnity

## 2013-11-07 LAB — COMPLETE METABOLIC PANEL WITH GFR
ALT: 39 U/L — ABNORMAL HIGH (ref 0–35)
AST: 33 U/L (ref 0–37)
Albumin: 4.4 g/dL (ref 3.5–5.2)
Alkaline Phosphatase: 69 U/L (ref 39–117)
BUN: 11 mg/dL (ref 6–23)
CO2: 25 mEq/L (ref 19–32)
Calcium: 9.3 mg/dL (ref 8.4–10.5)
Chloride: 99 mEq/L (ref 96–112)
Creat: 0.63 mg/dL (ref 0.50–1.10)
GFR, Est African American: >89 mL/min
GFR, Est Non African American: >89 mL/min
Glucose, Bld: 148 mg/dL — ABNORMAL HIGH (ref 70–99)
Potassium: 4.3 mEq/L (ref 3.5–5.3)
Sodium: 137 mEq/L (ref 135–145)
Total Bilirubin: 0.7 mg/dL (ref 0.2–1.2)
Total Protein: 6.9 g/dL (ref 6.0–8.3)

## 2013-11-07 LAB — HEMOGLOBIN A1C
Hgb A1c MFr Bld: 8.1 % — ABNORMAL HIGH (ref <5.7)
Mean Plasma Glucose: 186 mg/dL — ABNORMAL HIGH (ref <117)

## 2013-11-08 LAB — VARICELLA ZOSTER ANTIBODY, IGG: Varicella IgG: 527.7 Index — ABNORMAL HIGH (ref <135.00)

## 2013-11-08 LAB — T3, FREE: T3, Free: 4.1 pg/mL (ref 2.3–4.2)

## 2013-11-08 LAB — T4, FREE: Free T4: 1.8 ng/dL (ref 0.80–1.80)

## 2013-11-08 LAB — TSH: TSH: 0.057 u[IU]/mL — ABNORMAL LOW (ref 0.350–4.500)

## 2013-11-11 ENCOUNTER — Encounter: Payer: Self-pay | Admitting: Family Medicine

## 2013-11-11 ENCOUNTER — Ambulatory Visit (INDEPENDENT_AMBULATORY_CARE_PROVIDER_SITE_OTHER): Payer: Commercial Indemnity | Admitting: Family Medicine

## 2013-11-11 VITALS — BP 120/80 | HR 67 | Resp 16 | Ht 63.75 in | Wt 199.0 lb

## 2013-11-11 DIAGNOSIS — IMO0001 Reserved for inherently not codable concepts without codable children: Secondary | ICD-10-CM

## 2013-11-11 DIAGNOSIS — I10 Essential (primary) hypertension: Secondary | ICD-10-CM

## 2013-11-11 DIAGNOSIS — E1165 Type 2 diabetes mellitus with hyperglycemia: Secondary | ICD-10-CM

## 2013-11-11 DIAGNOSIS — E785 Hyperlipidemia, unspecified: Secondary | ICD-10-CM

## 2013-11-11 DIAGNOSIS — E039 Hypothyroidism, unspecified: Secondary | ICD-10-CM

## 2013-11-11 MED ORDER — SITAGLIPTIN PHOSPHATE 100 MG PO TABS: 100.0000 mg | ORAL_TABLET | Freq: Every day | ORAL | Status: AC

## 2013-11-11 MED ORDER — GLUCOSE BLOOD VI STRP: ORAL_STRIP | Status: AC

## 2013-11-11 MED ORDER — DAPAGLIFLOZIN PRO-METFORMIN ER 10-1000 MG PO TB24: 1.0000 | ORAL_TABLET | Freq: Every day | ORAL | Status: AC

## 2013-11-11 MED ORDER — PRAVASTATIN SODIUM 40 MG PO TABS: 40.0000 mg | ORAL_TABLET | Freq: Every day | ORAL | Status: AC

## 2013-11-11 MED ORDER — ATENOLOL 50 MG PO TABS: 50.0000 mg | ORAL_TABLET | Freq: Every day | ORAL | Status: AC

## 2013-11-11 MED ORDER — GLUCOSE BLOOD VI STRP: ORAL_STRIP | Status: DC

## 2013-11-11 NOTE — Patient Instructions (Signed)
1)  Blood Sugar - Improved; Keep up the good work - Add back Januvia to see if you can get under 7.    2)  Thyroid - Hold the Cytomel for now.  If you have days that you feel more fatigued you could try one.

## 2013-11-11 NOTE — Progress Notes (Signed)
Subjective:    Patient ID: Jocelyn LowensteinCynthia Todd, female    DOB: 1956/04/23, 58 y.o.   MRN: [id number]  HPI  Jocelyn Todd is here today to follow up on her recent lab results. We are going over the following issues as well:  1)  Type II DM:  She has been checking her sugars at home and says that they are looking very good. Her A1c has improved slightly to 8,1%.    2)  Hypothyroidism:  She is doing well on the levothyroxine and Cytomel.  3)  Hyperlipidemia:  She is taking the pravastatin.   4)  Hypertension:  Her BP is perfect on the Atenolol.     Review of Systems  Constitutional: Negative for activity change, appetite change and fatigue.  Cardiovascular: Negative for chest pain, palpitations and leg swelling.  Endocrine: Negative for cold intolerance, heat intolerance, polydipsia, polyphagia and polyuria.  All other systems reviewed and are negative.    Past Medical History  Diagnosis Date  . Hypertension   . Diabetes mellitus without complication     Type II  . Hyperlipidemia   . Migraine headache   . Hypothyroidism   . Graves disease   . TIA (transient ischemic attack)   . Vitamin D deficiency   . Cataract      Past Surgical History  Procedure Laterality Date  . 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 Diabetes Mother   . Hypertension Mother   . Heart disease Father   . Diabetes Father   . Hypertension Father      Current Outpatient  Prescriptions on File Prior to Visit  Medication Sig Dispense Refill  . aspirin 325 MG tablet Take 325 mg by mouth daily.      . Cholecalciferol (HM VITAMIN D3) 2000 UNITS CAPS Take 1 capsule by mouth daily.      . diclofenac sodium (VOLTAREN) 1 % GEL Apply up to 4 grams to 2 painful areas up to QID  10 Tube  5  . JANUMET XR 50-1000 MG TB24 TAKE 1 TWICE DAILY  60 tablet  0  . Lancets MISC by Does not apply route 3 (three) times daily.      Marland Kitchen. levothyroxine (SYNTHROID, LEVOTHROID) 150 MCG tablet Take 1 tablet (150 mcg total) by mouth daily before breakfast.  30 tablet  11  . liothyronine (CYTOMEL) 5 MCG tablet Take 1 tablet (5 mcg total) by mouth daily.  30 tablet  11  . vitamin B-12 (CYANOCOBALAMIN) 1000 MCG tablet Take 1,000 mcg by mouth daily.       No current facility-administered medications on file prior to visit.     Allergies  Allergen Reactions  . Benadryl [Diphenhydramine]     Headache   . Codeine     Severe mood change  Immunization History  Administered Date(s) Administered  . Influenza,inj,Quad PF,36+ Mos 03/21/2013  . Pneumococcal Conjugate-13 03/22/2009  . Tdap 02/04/2007       Objective:   Physical Exam  Vitals reviewed. Constitutional: She is oriented to person, place, and time.  Eyes: Conjunctivae are normal. No scleral icterus.  Neck: Neck supple. No thyromegaly present.  Cardiovascular: Normal rate, regular rhythm and normal heart sounds.   Pulmonary/Chest: Effort normal and breath sounds normal.  Musculoskeletal: She exhibits no edema and no tenderness.  Lymphadenopathy:    She has no cervical adenopathy.  Neurological: She is alert and oriented to person, place, and time.  Skin: Skin is warm and dry.  Psychiatric: She has a normal mood and affect. Her behavior is normal. Judgment and thought content normal.      Assessment & Plan:    Jocelyn Todd was seen today for medication management.  Diagnoses and associated orders for this  visit:  Essential hypertension, benign - atenolol (TENORMIN) 50 MG tablet; Take 1 tablet (50 mg total) by mouth daily.  Other and unspecified hyperlipidemia - pravastatin (PRAVACHOL) 40 MG tablet; Take 1 tablet (40 mg total) by mouth at bedtime.  Type II or unspecified type diabetes mellitus without mention of complication, uncontrolled - glucose blood test strip; Use as instructed - Dapagliflozin-Metformin HCl ER (XIGDUO XR) 03-999 MG TB24; Take 1 tablet by mouth daily. - sitaGLIPtin (JANUVIA) 100 MG tablet; Take 1 tablet (100 mg total) by mouth daily. - glucose blood test strip; Check blood sugar daily   TIME SPENT "FACE TO FACE" WITH PATIENT -  30 MINS

## 2013-12-18 ENCOUNTER — Other Ambulatory Visit: Payer: Self-pay | Admitting: Family Medicine

## 2014-01-16 DIAGNOSIS — IMO0001 Reserved for inherently not codable concepts without codable children: Secondary | ICD-10-CM | POA: Insufficient documentation

## 2014-01-16 DIAGNOSIS — E1165 Type 2 diabetes mellitus with hyperglycemia: Secondary | ICD-10-CM

## 2014-01-25 ENCOUNTER — Other Ambulatory Visit: Payer: Self-pay | Admitting: Family Medicine

## 2014-11-27 ENCOUNTER — Other Ambulatory Visit: Payer: Self-pay
